# Patient Record
Sex: Female | Born: 1970 | Race: White | Hispanic: No | Marital: Married | Smoking: Never smoker
Health system: Southern US, Community
[De-identification: ages and names within clinical notes are randomized; demographics above are authoritative.]

## PROBLEM LIST (undated history)

## (undated) DIAGNOSIS — C439 Malignant melanoma of skin, unspecified: Secondary | ICD-10-CM

## (undated) DIAGNOSIS — I639 Cerebral infarction, unspecified: Secondary | ICD-10-CM

## (undated) DIAGNOSIS — I10 Essential (primary) hypertension: Secondary | ICD-10-CM

## (undated) DIAGNOSIS — E785 Hyperlipidemia, unspecified: Secondary | ICD-10-CM

## (undated) DIAGNOSIS — E119 Type 2 diabetes mellitus without complications: Secondary | ICD-10-CM

## (undated) HISTORY — DX: Cerebral infarction, unspecified: I63.9

## (undated) HISTORY — DX: Malignant melanoma of skin, unspecified: C43.9

## (undated) HISTORY — DX: Hyperlipidemia, unspecified: E78.5

## (undated) HISTORY — DX: Type 2 diabetes mellitus without complications: E11.9

## (undated) HISTORY — DX: Essential (primary) hypertension: I10

---

## 2020-10-01 ENCOUNTER — Other Ambulatory Visit: Payer: Self-pay | Admitting: Family Medicine

## 2020-10-01 DIAGNOSIS — Z1231 Encounter for screening mammogram for malignant neoplasm of breast: Secondary | ICD-10-CM

## 2021-01-11 ENCOUNTER — Ambulatory Visit
Admission: RE | Admit: 2021-01-11 | Discharge: 2021-01-11 | Disposition: A | Discharge status: Home / Self Care | Payer: 59 | Source: Ambulatory Visit | Attending: Family Medicine | Admitting: Family Medicine

## 2021-01-11 ENCOUNTER — Other Ambulatory Visit: Payer: Self-pay

## 2021-01-11 DIAGNOSIS — Z1231 Encounter for screening mammogram for malignant neoplasm of breast: Secondary | ICD-10-CM

## 2021-06-01 ENCOUNTER — Other Ambulatory Visit: Payer: Self-pay | Admitting: Family Medicine

## 2021-06-01 DIAGNOSIS — R112 Nausea with vomiting, unspecified: Secondary | ICD-10-CM

## 2021-06-06 ENCOUNTER — Ambulatory Visit
Admission: RE | Admit: 2021-06-06 | Discharge: 2021-06-06 | Disposition: A | Discharge status: Home / Self Care | Payer: 59 | Source: Ambulatory Visit | Attending: Family Medicine | Admitting: Family Medicine

## 2021-06-06 DIAGNOSIS — R112 Nausea with vomiting, unspecified: Secondary | ICD-10-CM

## 2021-06-29 ENCOUNTER — Other Ambulatory Visit (HOSPITAL_COMMUNITY): Payer: Self-pay | Admitting: Family Medicine

## 2021-06-29 ENCOUNTER — Other Ambulatory Visit: Payer: Self-pay | Admitting: Family Medicine

## 2021-06-29 DIAGNOSIS — R112 Nausea with vomiting, unspecified: Secondary | ICD-10-CM

## 2021-07-06 ENCOUNTER — Other Ambulatory Visit: Payer: Self-pay

## 2021-07-06 ENCOUNTER — Ambulatory Visit (HOSPITAL_COMMUNITY)
Admission: RE | Admit: 2021-07-06 | Discharge: 2021-07-06 | Disposition: A | Discharge status: Home / Self Care | Payer: 59 | Source: Ambulatory Visit | Attending: Family Medicine | Admitting: Family Medicine

## 2021-07-06 DIAGNOSIS — R112 Nausea with vomiting, unspecified: Secondary | ICD-10-CM | POA: Diagnosis present

## 2021-07-06 IMAGING — NM NM HEPATO W/GB/PHARM/[PERSON_NAME]
2 series · 12 of 12 positions shown · non-contrast
Comparison: None.

CLINICAL DATA: Diarrhea and fatty liver

EXAM:
NUCLEAR MEDICINE HEPATOBILIARY IMAGING WITH GALLBLADDER EF
TECHNIQUE: Sequential images of the abdomen were obtained [DATE] minutes
following intravenous administration of radiopharmaceutical. After
oral ingestion of Ensure, gallbladder ejection fraction was
determined. At 60 min, normal ejection fraction is greater than 33%.
RADIOPHARMACEUTICALS:  4.8 mCi [LB]  Choletec IV

[Series 1: biliary · 4.14mm/px · 6 of 60 frames shown]
[frame 6/60]
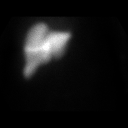
[frame 16/60]
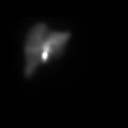
[frame 26/60]
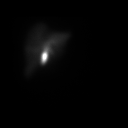
[frame 36/60]
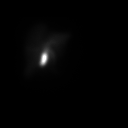
[frame 46/60]
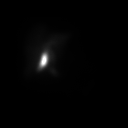
[frame 56/60]
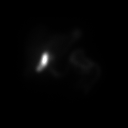

[Series 2: gbef · 4.14mm/px · 6 of 60 frames shown]
[frame 6/60]
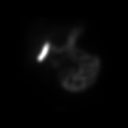
[frame 16/60]
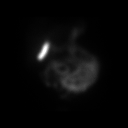
[frame 26/60]
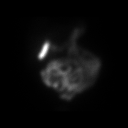
[frame 36/60]
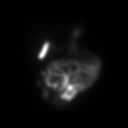
[frame 46/60]
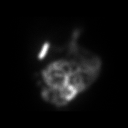
[frame 56/60]
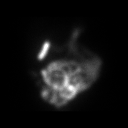

[12 of 12 positions shown; findings below may reference images not displayed]

FINDINGS: Prompt uptake and biliary excretion of activity by the liver is
seen. Gallbladder activity is visualized, consistent with patency of
cystic duct. Biliary activity passes into small bowel, consistent
with patent common bile duct.

Calculated gallbladder ejection fraction is 64%. (Normal gallbladder
ejection fraction with Ensure is greater than 33%.)
IMPRESSION: Normal uptake and excretion of biliary tracer.

Normal gallbladder ejection fraction at 64%.

## 2021-07-06 MED ORDER — TECHNETIUM TC 99M MEBROFENIN IV KIT
4.8000 | PACK | Freq: Once | INTRAVENOUS | Status: AC | PRN
  Administered 2021-07-06: 11:00:00 4.8 via INTRAVENOUS

## 2021-11-10 ENCOUNTER — Other Ambulatory Visit: Payer: Self-pay | Admitting: Family Medicine

## 2021-11-10 DIAGNOSIS — Z1231 Encounter for screening mammogram for malignant neoplasm of breast: Secondary | ICD-10-CM

## 2022-01-09 ENCOUNTER — Other Ambulatory Visit: Payer: Self-pay | Admitting: Cardiovascular Disease

## 2022-01-09 ENCOUNTER — Ambulatory Visit: Payer: 59 | Admitting: Cardiovascular Disease

## 2022-01-09 ENCOUNTER — Encounter: Payer: Self-pay | Admitting: Cardiovascular Disease

## 2022-01-09 VITALS — BP 110/60 | HR 63 | Ht 65.0 in | Wt 236.4 lb

## 2022-01-09 DIAGNOSIS — I4891 Unspecified atrial fibrillation: Secondary | ICD-10-CM

## 2022-01-09 DIAGNOSIS — I639 Cerebral infarction, unspecified: Secondary | ICD-10-CM

## 2022-01-10 LAB — BASIC METABOLIC PANEL
BUN/Creatinine Ratio: 14 (ref 9–23)
BUN: 9 mg/dL (ref 6–24)
CO2: 23 mmol/L (ref 20–29)
Calcium: 9.7 mg/dL (ref 8.7–10.2)
Chloride: 106 mmol/L (ref 96–106)
Creatinine, Ser: 0.63 mg/dL (ref 0.57–1.00)
Glucose: 83 mg/dL (ref 70–99)
Potassium: 4.4 mmol/L (ref 3.5–5.2)
Sodium: 145 mmol/L — ABNORMAL HIGH (ref 134–144)
eGFR: 107 mL/min/{1.73_m2} (ref >59)

## 2022-01-12 ENCOUNTER — Ambulatory Visit (INDEPENDENT_AMBULATORY_CARE_PROVIDER_SITE_OTHER): Payer: 59

## 2022-01-12 DIAGNOSIS — I4891 Unspecified atrial fibrillation: Secondary | ICD-10-CM

## 2022-01-12 DIAGNOSIS — I639 Cerebral infarction, unspecified: Secondary | ICD-10-CM | POA: Diagnosis not present

## 2022-01-20 ENCOUNTER — Ambulatory Visit
Admission: RE | Admit: 2022-01-20 | Discharge: 2022-01-20 | Disposition: A | Discharge status: Home / Self Care | Payer: 59 | Source: Ambulatory Visit

## 2022-01-20 DIAGNOSIS — Z1231 Encounter for screening mammogram for malignant neoplasm of breast: Secondary | ICD-10-CM

## 2022-01-24 ENCOUNTER — Encounter (HOSPITAL_COMMUNITY): Payer: Self-pay | Admitting: Cardiology

## 2022-01-24 NOTE — Progress Notes (Signed)
Attempted to obtain medical history via telephone, unable to reach at this time. HIPAA compliant voicemail message left requesting return call to pre surgical testing department. 

## 2022-01-25 ENCOUNTER — Encounter: Payer: Self-pay | Admitting: Neurology

## 2022-01-25 ENCOUNTER — Ambulatory Visit: Payer: 59 | Admitting: Neurology

## 2022-01-25 VITALS — BP 123/83 | HR 69 | Ht 65.0 in | Wt 232.0 lb

## 2022-01-25 DIAGNOSIS — I63512 Cerebral infarction due to unspecified occlusion or stenosis of left middle cerebral artery: Secondary | ICD-10-CM

## 2022-01-25 DIAGNOSIS — R404 Transient alteration of awareness: Secondary | ICD-10-CM

## 2022-01-25 NOTE — Patient Instructions (Addendum)
Continue with Aspirin 81 mg daily  Continue with your other mediaitons  Ambulatory EEG  No need for TEE Return in 1 year

## 2022-01-25 NOTE — Progress Notes (Signed)
[facility]  PATIENT: Mercedes [name] DOB: 24-Jul-1970  REQUESTING CLINICIAN: Kathyrn Lass, MD HISTORY FROM: Patient  REASON FOR VISIT: Left frontal stroke follow up    HISTORICAL  CHIEF COMPLAINT:  Chief Complaint  Patient presents with   New Patient (Initial Visit)    Room 12, alone Her for CVA FU, States she is doing well     HISTORY OF PRESENT ILLNESS:  This is a 51 year old woman with vascular risk factor including hypertension, hyperlipidemia, diabetes mellitus and obesity who is presenting after being diagnosed with left frontal stroke on June 11.  Patient reports on that day she was driving home and had a sudden onset of inability to focus, asking the same questions, she was able to pull over and son drove her to the ED.  In the ED she was initially thought to have a TGA but MRI showed an acute left frontal stroke and an old left frontal stroke.  She was admitted, had additional work-up including CTA head and neck which showed no large vessel occlusion, EEG which was negative for epileptiform discharges and echocardiogram with a normal EF. While she was admitted also she was found to have UTI and treated with antibiotics.  Currently she is back to her normal self and denies any symptoms and has no complaints. She did follow with cardiology and currently is already wearing a heart monitor Patient also report a week prior to presentation she had a similar episode of confusion and inability to focus lasting couple hours.  A years ago she had the same episode, in total she had 3 episodes.     OTHER MEDICAL CONDITIONS: Diabetes Mellitus, Hypertension,    REVIEW OF SYSTEMS: Full 14 system review of systems performed and negative with exception of: as noted in the HPI   ALLERGIES: Allergies  Allergen Reactions   Codeine Nausea And Vomiting   Nitrofurantoin Hives         HOME MEDICATIONS: Outpatient Medications Prior to Visit  Medication Sig  Dispense Refill   aspirin EC 325 MG tablet Take 325 mg by mouth daily.     cetirizine (ZYRTEC) 10 MG tablet Take 10 mg by mouth every evening.     Cholecalciferol (VITAMIN D) 50 MCG (2000 UT) CAPS Take 2,000 Units by mouth daily.     cyanocobalamin 100 MCG tablet Take 100 mcg by mouth daily.     empagliflozin (JARDIANCE) 25 MG TABS tablet Take 25 mg by mouth daily.     metFORMIN (GLUCOPHAGE-XR) 500 MG 24 hr tablet Take 2,000 mg by mouth every evening.     MOUNJARO 5 MG/0.5ML Pen Inject 5 mg into the skin every Friday.     MULTIPLE VITAMINS-MINERALS PO Take 1 tablet by mouth daily.     olmesartan (BENICAR) 20 MG tablet Take 20 mg by mouth daily.     rosuvastatin (CRESTOR) 10 MG tablet Take 10 mg by mouth daily.     No facility-administered medications prior to visit.    PAST MEDICAL HISTORY: Past Medical History:  Diagnosis Date   Diabetes mellitus without complication ([place])    Hyperlipidemia    Hypertension    Melanoma ([place])    Stroke ([place])     PAST SURGICAL HISTORY: History reviewed. No pertinent surgical history.  FAMILY HISTORY: Family History  Problem Relation Age of Onset   Arrhythmia Father    Heart attack Brother     SOCIAL HISTORY: Social History   Socioeconomic History   Marital status: Married  Spouse name: Not on file   Number of children: 2   Years of education: Not on file   Highest education level: Not on file  Occupational History   Occupation: [employer]  Tobacco Use   Smoking status: Never   Smokeless tobacco: Never  Substance and Sexual Activity   Alcohol use: Never   Drug use: Never   Sexual activity: Not on file  Other Topics Concern   Not on file  Social History Narrative   Not on file   Social Determinants of Health   Financial Resource Strain: Not on file  Food Insecurity: Not on file  Transportation Needs: Not on file  Physical Activity: Not on file  Stress: Not on file  Social Connections: Not on file  Intimate Partner  Violence: Not on file    PHYSICAL EXAM  GENERAL EXAM/CONSTITUTIONAL: Vitals:  Vitals:   01/25/22 0809  BP: 123/83  Pulse: 69  Weight: 232 lb (105.2 kg)  [name]: '5\' 5"'$  (1.651 m)   Body mass index is 38.61 kg/m. Wt Readings from Last 3 Encounters:  01/25/22 232 lb (105.2 kg)  01/09/22 236 lb 6.4 oz (107.2 kg)   Patient is in no distress; well developed, nourished and groomed; neck is supple  EYES: Pupils round and reactive to light, Visual fields full to confrontation, Extraocular movements intacts,   MUSCULOSKELETAL: Gait, strength, tone, movements noted in Neurologic exam below  NEUROLOGIC: MENTAL STATUS:      No data to display         awake, alert, oriented to person, place and time recent and remote memory intact normal attention and concentration language fluent, comprehension intact, naming intact fund of knowledge appropriate  CRANIAL NERVE:  2nd, 3rd, 4th, 6th - pupils equal and reactive to light, visual fields full to confrontation, extraocular muscles intact, no nystagmus 5th - facial sensation symmetric 7th - facial strength symmetric 8th - hearing intact 9th - palate elevates symmetrically, uvula midline 11th - shoulder shrug symmetric 12th - tongue protrusion midline  MOTOR:  normal bulk and tone, full strength in the BUE, BLE  SENSORY:  normal and symmetric to light touch, pinprick, temperature, vibration  COORDINATION:  finger-nose-finger, fine finger movements normal  REFLEXES:  deep tendon reflexes present and symmetric  GAIT/STATION:  normal   DIAGNOSTIC DATA (LABS, IMAGING, TESTING) - I reviewed patient records, labs, notes, testing and imaging myself where available.  No results found for: "WBC", "HGB", "HCT", "MCV", "PLT"    Component Value Date/Time   NA 145 (H) 01/09/2022 1648   K 4.4 01/09/2022 1648   CL 106 01/09/2022 1648   CO2 23 01/09/2022 1648   GLUCOSE 83 01/09/2022 1648   BUN 9 01/09/2022 1648   CREATININE  0.63 01/09/2022 1648   CALCIUM 9.7 01/09/2022 1648   No results found for: "CHOL", "HDL", "LDLCALC", "LDLDIRECT", "TRIG", "CHOLHDL" No results found for: "HGBA1C" No results found for: "VITAMINB12" No results found for: "TSH"  EEG: normal, no seizures, no epileptiform discharge  CT Head: No acute abnormalities  MRI Brain: Old left frontal stroke and acute stroke in the left parietal paramedian cortex  CTA Head and Neck: No large vessel occlusion  Normal Echo with EF 60-65%    ASSESSMENT AND PLAN  51 y.o. year old female with vascular risk factors including hypertension, hyperlipidemia, diabetes mellitus who is presenting after being diagnosed with left frontal stroke.  Stroke etiology likely small vessel disease.  She has completed DAPT with aspirin and Plavix for 21 days  and now on aspirin 325 mg.  I have recommended patient to go back to aspirin 81 mg daily, continue her other medications.  For her recurrent episode of confusion and inability to focus, she had a routine EEG which was negative for any epileptiform discharges but I think it is reasonable to obtain a 48-hour ambulatory EEG.  I will contact the patient to go over the result, if abnormal will most likely start antiseizure medication.  I will see her in 1 year for follow-up. Advised her to contact me sooner if her symptoms worsen    1. Cerebrovascular accident (CVA) due to occlusion of left middle cerebral artery ([place])   2. Transient alteration of awareness      Patient Instructions  Continue with Aspirin 81 mg daily  Continue with your other mediaitons  Ambulatory EEG  No need for TEE Return in 1 year   No orders of the defined types were placed in this encounter.   No orders of the defined types were placed in this encounter.   Return in about 1 year (around 01/26/2023).  I have spent a total of 60 minutes dedicated to this patient today, preparing to see patient, performing a medically appropriate examination  and evaluation, ordering tests and/or medications and procedures, and counseling and educating the patient/family/caregiver; independently interpreting result and communicating results to the family/patient/caregiver; and documenting clinical information in the electronic medical record.  Alric Ran, MD 01/25/2022, 8:50 AM  [facility] [address], [place], [place] [address] [phone]

## 2022-01-31 ENCOUNTER — Ambulatory Visit (HOSPITAL_COMMUNITY): Admission: RE | Admit: 2022-01-31 | Payer: 59 | Source: Home / Self Care | Admitting: Cardiology

## 2022-01-31 ENCOUNTER — Encounter (HOSPITAL_COMMUNITY): Admission: RE | Payer: Self-pay | Source: Home / Self Care

## 2022-01-31 DIAGNOSIS — I639 Cerebral infarction, unspecified: Secondary | ICD-10-CM

## 2022-01-31 SURGERY — ECHOCARDIOGRAM, TRANSESOPHAGEAL
Anesthesia: Monitor Anesthesia Care

## 2022-02-28 DIAGNOSIS — G40909 Epilepsy, unspecified, not intractable, without status epilepticus: Secondary | ICD-10-CM | POA: Diagnosis not present

## 2022-03-01 DIAGNOSIS — G40909 Epilepsy, unspecified, not intractable, without status epilepticus: Secondary | ICD-10-CM | POA: Diagnosis not present

## 2022-03-07 ENCOUNTER — Encounter: Payer: Self-pay | Admitting: Neurology

## 2022-03-07 DIAGNOSIS — R404 Transient alteration of awareness: Secondary | ICD-10-CM

## 2022-03-07 NOTE — Procedures (Signed)
   Clinical History : This is a 51 y/o F with vascular factors including HTN, hyperlipidemia, and diabetes who presents after recent diagnosis with left frontal stroke.  INTERMITTENT MONITORING with VIDEO TECHNICAL SUMMARY: This AVEEG was performed using equipment provided by [employer] utilizing Bluetooth ( [employer] ) amplifiers with continuous EEGT attended video collection using encrypted remote transmission via [place] secured cellular tower network with data rates for each AVEEG performed. This is a Biomedical engineer AVEEG, obtained, according to the 10-20 international electrode placement system, reformatted digitally into referential and bipolar montages. Data was acquired with a minimum of 21 bipolar connections and sampled at a minimum rate of 250 cycles per second per channel, maximum rate of 450 cycles per second per channel and two channels for EKG. The entire VEEG study was recorded through cable and or radio telemetry for subsequent analysis. Specified epochs of the AVEEG data were identified at the direction of the subject by the depression of a push button by the patient. Each patients event file included data acquired two minutes prior to the push button activation and continuing until two minutes afterwards. AVEEG files were reviewed on [place], Licensed Software provided by [employer] with a digital high frequency filter set at 70 Hz and a low frequency filter set at 1 Hz with a paper speed of 54m/s resulting in 10 seconds per digital page. This entire AVEEG was reviewed by the EEG Technologist. Random time samples, random sleep samples, clips, patient initiated push button files with included patient daily diary logs, EEG Technologist pruned data was reviewed and verified for accuracy and validity by the governing reading neurologist in full details. This AEEGV was fully compliant with all requirements for CPT 97500 for setup, patient education, take down and  administered by an EEG technologist. Long-Term EEG with Video was monitored intermittently by a qualified EEG technologist for the entirety of the recording; quality check-ins were performed at a minimum of every two hours, checking and documenting real-time data and video to assure the integrity and quality of the recording (e.g., camera position, electrode integrity and impedance), and identify the need for maintenance. For intermittent monitoring, an EEG Technologist monitored no more than 12 patients concurrently. Diagnostic video was captured at least 80% of the time during the recording.  PATIENT EVENTS: There were no patient events noted or captured during this recording.  TECHNOLOGIST EVENTS: No clear epileptiform activity was detected by the reviewing neurodiagnostic technologist during the recording for further evaluation.  TIME SAMPLES: 10-minutes of every 2 hours recorded are reviewed as random time samples.  SLEEP SAMPLES: 5-minutes of every 24 hour recorded sleep cycle are reviewed as random sleep samples.  AWAKE: At maximal level of alertness, the posterior dominant background activity was continuous, reactive, low voltage rhythm of 10-10.5 Hz. This was symmetric, well-modulated, and attenuated with eye opening. Diffuse, symmetric, frontocentral beta range activity was present.  SLEEP: N1 Sleep (Stage 1) was observed and characterized by the disappearance of alpha rhythm and the appearance of vertex activity. N2 Sleep (Stage 2) was observed and characterized by vertex waves, K-complexes, and sleep spindles. N3 (Stage 3) sleep was observed and characterized by high amplitude Delta activity of 20%. REM sleep was observed.  EKG: There were no arrhythmias or abnormalities noted during this recording.   Impression: This was a normal 48 hours ambulatory EEG. No seizures, no epileptiform discharges and no events were captured.   AAlric Ran MD [facility]

## 2022-03-07 NOTE — Progress Notes (Signed)
Thank you Dr. Audie Box

## 2022-03-22 NOTE — Progress Notes (Deleted)
Cardiology Office Note:   Date:  03/22/2022  NAME:  Georgiann Neider    MRN: [id number] DOB:  04/01/1971   PCP:  Kathyrn Lass, MD  Cardiologist:  None  Electrophysiologist:  None   Referring MD: Kathyrn Lass, MD   No chief complaint on file. ***  History of Present Illness:   Alishia Lebo is a 51 y.o. female with a hx of DM, CVA, HLD, HTN who presents for follow-up.   Problem List CVA -L parietal paramedian cortex  -negative monitor  DM -A1c 8.1 HLD -T chol 115, HDL 54, LDL 38, TG 117 HTN  Past Medical History: Past Medical History:  Diagnosis Date   Diabetes mellitus without complication ([place])    Hyperlipidemia    Hypertension    Melanoma ([place])    Stroke [facility])     Past Surgical History: No past surgical history on file.  Current Medications: No outpatient medications have been marked as taking for the 03/24/22 encounter (Appointment) with O'Neal, Cassie Freer, MD.     Allergies:    Codeine and Nitrofurantoin   Social History: Social History   Socioeconomic History   Marital status: Married    Spouse name: Not on file   Number of children: 2   Years of education: Not on file   Highest education level: Not on file  Occupational History   Occupation: Licensed conveyancer  Tobacco Use   Smoking status: Never   Smokeless tobacco: Never  Substance and Sexual Activity   Alcohol use: Never   Drug use: Never   Sexual activity: Not on file  Other Topics Concern   Not on file  Social History Narrative   Not on file   Social Determinants of Health   Financial Resource Strain: Not on file  Food Insecurity: Not on file  Transportation Needs: Not on file  Physical Activity: Not on file  Stress: Not on file  Social Connections: Not on file     Family History: The patient's ***family history includes Arrhythmia in her father; Heart attack in her brother.  ROS:   All other ROS reviewed and negative. Pertinent positives noted in the HPI.     EKGs/Labs/Other  Studies Reviewed:   The following studies were personally reviewed by me today:  EKG:  EKG is *** ordered today.  The ekg ordered today demonstrates ***, and was personally reviewed by me.   Recent Labs: 01/09/2022: BUN 9; Creatinine, Ser 0.63; Potassium 4.4; Sodium 145   Recent Lipid Panel No results found for: "CHOL", "TRIG", "HDL", "CHOLHDL", "VLDL", "LDLCALC", "LDLDIRECT"  Physical Exam:   VS:  There were no vitals taken for this visit.   Wt Readings from Last 3 Encounters:  01/25/22 232 lb (105.2 kg)  01/09/22 236 lb 6.4 oz (107.2 kg)    General: Well nourished, well developed, in no acute distress Head: Atraumatic, normal size  Eyes: PEERLA, EOMI  Neck: Supple, no JVD Endocrine: No thryomegaly Cardiac: Normal S1, S2; RRR; no murmurs, rubs, or gallops Lungs: Clear to auscultation bilaterally, no wheezing, rhonchi or rales  Abd: Soft, nontender, no hepatomegaly  Ext: No edema, pulses 2+ Musculoskeletal: No deformities, BUE and BLE strength normal and equal Skin: Warm and dry, no rashes   Neuro: Alert and oriented to person, place, time, and situation, CNII-XII grossly intact, no focal deficits  Psych: Normal mood and affect   ASSESSMENT:   Leaira Fullam is a 51 y.o. female who presents for the following: No diagnosis found.  PLAN:   There  are no diagnoses linked to this encounter.  {Are you ordering a CV Procedure (e.g. stress test, cath, DCCV, TEE, etc)?   Press F2        :[id number]}  Disposition: No follow-ups on file.  Medication Adjustments/Labs and Tests Ordered: Current medicines are reviewed at length with the patient today.  Concerns regarding medicines are outlined above.  No orders of the defined types were placed in this encounter.  No orders of the defined types were placed in this encounter.   There are no Patient Instructions on file for this visit.   Time Spent with Patient: I have spent a total of *** minutes with patient reviewing hospital  notes, telemetry, EKGs, labs and examining the patient as well as establishing an assessment and plan that was discussed with the patient.  > 50% of time was spent in direct patient care.  Signed, Addison Naegeli. Audie Box, MD, [place]  [address], [place], [place] [address] [phone]  03/22/2022 8:52 PM

## 2022-03-24 ENCOUNTER — Ambulatory Visit: Payer: 59 | Attending: Cardiovascular Disease | Admitting: Cardiovascular Disease

## 2022-03-24 DIAGNOSIS — I639 Cerebral infarction, unspecified: Secondary | ICD-10-CM

## 2022-11-28 ENCOUNTER — Other Ambulatory Visit: Payer: Self-pay | Admitting: Family Medicine

## 2022-11-28 DIAGNOSIS — Z1231 Encounter for screening mammogram for malignant neoplasm of breast: Secondary | ICD-10-CM

## 2022-12-29 ENCOUNTER — Other Ambulatory Visit: Payer: Self-pay | Admitting: Family Medicine

## 2022-12-29 DIAGNOSIS — Z8262 Family history of osteoporosis: Secondary | ICD-10-CM

## 2022-12-29 DIAGNOSIS — Z780 Asymptomatic menopausal state: Secondary | ICD-10-CM

## 2022-12-29 DIAGNOSIS — Z1231 Encounter for screening mammogram for malignant neoplasm of breast: Secondary | ICD-10-CM

## 2023-01-03 ENCOUNTER — Other Ambulatory Visit (HOSPITAL_BASED_OUTPATIENT_CLINIC_OR_DEPARTMENT_OTHER): Payer: Self-pay | Admitting: Physician Assistant

## 2023-01-03 DIAGNOSIS — R198 Other specified symptoms and signs involving the digestive system and abdomen: Secondary | ICD-10-CM

## 2023-01-04 ENCOUNTER — Other Ambulatory Visit (HOSPITAL_BASED_OUTPATIENT_CLINIC_OR_DEPARTMENT_OTHER): Payer: Self-pay | Admitting: Physician Assistant

## 2023-01-04 ENCOUNTER — Ambulatory Visit (HOSPITAL_BASED_OUTPATIENT_CLINIC_OR_DEPARTMENT_OTHER)
Admission: RE | Admit: 2023-01-04 | Discharge: 2023-01-04 | Disposition: A | Discharge status: Home / Self Care | Payer: 59 | Source: Ambulatory Visit | Attending: Physician Assistant | Admitting: Physician Assistant

## 2023-01-04 DIAGNOSIS — R198 Other specified symptoms and signs involving the digestive system and abdomen: Secondary | ICD-10-CM

## 2023-01-15 ENCOUNTER — Other Ambulatory Visit (HOSPITAL_BASED_OUTPATIENT_CLINIC_OR_DEPARTMENT_OTHER): Payer: Self-pay

## 2023-01-15 MED ORDER — MOUNJARO 12.5 MG/0.5ML ~~LOC~~ SOAJ
12.5000 mg | SUBCUTANEOUS | 0 refills | Status: DC
  Filled 2023-01-15: qty 2, 28d supply, fill #0

## 2023-01-29 ENCOUNTER — Encounter: Payer: Self-pay | Admitting: Neurology

## 2023-01-29 ENCOUNTER — Ambulatory Visit: Payer: 59 | Admitting: Neurology

## 2023-01-29 VITALS — BP 123/75 | HR 70 | Ht 65.0 in | Wt 219.0 lb

## 2023-01-29 DIAGNOSIS — I63512 Cerebral infarction due to unspecified occlusion or stenosis of left middle cerebral artery: Secondary | ICD-10-CM | POA: Diagnosis not present

## 2023-01-29 MED ORDER — ASPIRIN 81 MG PO TBEC: 81.0000 mg | DELAYED_RELEASE_TABLET | Freq: Every day | ORAL | 12 refills | Status: AC

## 2023-01-29 NOTE — Progress Notes (Signed)
[facility]  PATIENT: Mercedes [name] DOB: [birth year]  REQUESTING CLINICIAN: Sigmund Hazel, MD HISTORY FROM: Patient  REASON FOR VISIT: Left frontal stroke follow up    HISTORICAL  CHIEF COMPLAINT:  Chief Complaint  Patient presents with   Cerebrovascular Accident    Rm12, alone, pt is well w/no problems to report      INTERVAL HISTORY 01/29/2023:  Patient presents today for follow-up, last visit was a year ago since then she reports that she has been doing very well.  She did have an ambulatory EEG for her periods of confusion which was negative.  She has not had any additional episode of confusion.  During this time she reported losing weight, being off her diabetes medication her hemoglobin A1c is now in the normal range currently she is only on the [facility], she is not taking Jardiance or metformin anymore.  She continued aspirin 81 mg and Crestor 10.  Currently has no other complaint, no other concerns.  Overall she is doing very well.   HISTORY OF PRESENT ILLNESS:  This is a 52 year old woman with vascular risk factor including hypertension, hyperlipidemia, diabetes mellitus and obesity who is presenting after being diagnosed with left frontal stroke on June 11.  Patient reports on that day she was driving home and had a sudden onset of inability to focus, asking the same questions, she was able to pull over and son drove her to the ED.  In the ED she was initially thought to have a TGA but MRI showed an acute left frontal stroke and an old left frontal stroke.  She was admitted, had additional work-up including CTA head and neck which showed no large vessel occlusion, EEG which was negative for epileptiform discharges and echocardiogram with a normal EF. While she was admitted also she was found to have UTI and treated with antibiotics.  Currently she is back to her normal self and denies any symptoms and has no complaints. She did follow with cardiology and  currently is already wearing a heart monitor Patient also report a week prior to presentation she had a similar episode of confusion and inability to focus lasting couple hours.  A years ago she had the same episode, in total she had 3 episodes.     OTHER MEDICAL CONDITIONS: Diabetes Mellitus, Hypertension,    REVIEW OF SYSTEMS: Full 14 system review of systems performed and negative with exception of: as noted in the HPI   ALLERGIES: Allergies  Allergen Reactions   Codeine Nausea And Vomiting   Nitrofurantoin Hives         HOME MEDICATIONS: Outpatient Medications Prior to Visit  Medication Sig Dispense Refill   cetirizine (ZYRTEC) 10 MG tablet Take 10 mg by mouth every evening.     Cholecalciferol (VITAMIN D) 50 MCG (2000 UT) CAPS Take 2,000 Units by mouth daily.     cyanocobalamin 100 MCG tablet Take 100 mcg by mouth daily.     MULTIPLE VITAMINS-MINERALS PO Take 1 tablet by mouth daily.     olmesartan (BENICAR) 20 MG tablet Take 20 mg by mouth daily.     rosuvastatin (CRESTOR) 10 MG tablet Take 10 mg by mouth daily.     tirzepatide (MOUNJARO) 12.5 MG/0.5ML Pen Inject 12.5 mg into the skin once a week. 2 mL 0   aspirin EC 325 MG tablet Take 325 mg by mouth daily.     empagliflozin (JARDIANCE) 25 MG TABS tablet Take 25 mg by mouth daily.  metFORMIN (GLUCOPHAGE-XR) 500 MG 24 hr tablet Take 2,000 mg by mouth every evening.     MOUNJARO 5 MG/0.5ML Pen Inject 5 mg into the skin every Friday.     No facility-administered medications prior to visit.    PAST MEDICAL HISTORY: Past Medical History:  Diagnosis Date   Diabetes mellitus without complication (HCC)    Hyperlipidemia    Hypertension    Melanoma (HCC)    Stroke (HCC)     PAST SURGICAL HISTORY: History reviewed. No pertinent surgical history.  FAMILY HISTORY: Family History  Problem Relation Age of Onset   Arrhythmia Father    Heart attack Brother     SOCIAL HISTORY: Social History   Socioeconomic History    Marital status: Legally Separated    Spouse name: Not on file   Number of children: 2   Years of education: Not on file   Highest education level: Not on file  Occupational History   Occupation: Publishing copy  Tobacco Use   Smoking status: Never   Smokeless tobacco: Never  Vaping Use   Vaping status: Never Used  Substance and Sexual Activity   Alcohol use: Never   Drug use: Never   Sexual activity: Yes    Birth control/protection: None  Other Topics Concern   Not on file  Social History Narrative   Not on file   Social Determinants of Health   Financial Resource Strain: Not on file  Food Insecurity: Not on file  Transportation Needs: No Transportation Needs (12/26/2021)   Received from [facility], [facility]   PRAPARE - Transportation    Lack of Transportation (Medical): No    Lack of Transportation (Non-Medical): No  Physical Activity: Not on file  Stress: Not on file  Social Connections: Unknown (12/25/2021)   Received from [facility], [facility]   Social Network    Social Network: Not on file  Intimate Partner Violence: Unknown (12/25/2021)   Received from [facility], [facility]   HITS    Physically Hurt: Not on file    Insult or Talk Down To: Not on file    Threaten Physical Harm: Not on file    Scream or Curse: Not on file    PHYSICAL EXAM  GENERAL EXAM/CONSTITUTIONAL: Vitals:  Vitals:   01/29/23 0915  BP: 123/75  Pulse: 70  Weight: 219 lb (99.3 kg)  Height: 5\' 5"  (1.651 m)    Body mass index is 36.44 kg/m. Wt Readings from Last 3 Encounters:  01/29/23 219 lb (99.3 kg)  01/25/22 232 lb (105.2 kg)  01/09/22 236 lb 6.4 oz (107.2 kg)   Patient is in no distress; well developed, nourished and groomed; neck is supple  MUSCULOSKELETAL: Gait, strength, tone, movements noted in Neurologic exam below  NEUROLOGIC: MENTAL STATUS:      No data to display         awake, alert, oriented to person, place and time recent and remote  memory intact normal attention and concentration language fluent, comprehension intact, naming intact fund of knowledge appropriate  CRANIAL NERVE:  2nd, 3rd, 4th, 6th -visual fields full to confrontation, extraocular muscles intact, no nystagmus 5th - facial sensation symmetric 7th - facial strength symmetric 8th - hearing intact 9th - palate elevates symmetrically, uvula midline 11th - shoulder shrug symmetric 12th - tongue protrusion midline  MOTOR:  normal bulk and tone, full strength in the BUE, BLE  SENSORY:  normal and symmetric to light touch,  COORDINATION:  finger-nose-finger, fine finger movements  normal  REFLEXES:  deep tendon reflexes present and symmetric  GAIT/STATION:  normal   DIAGNOSTIC DATA (LABS, IMAGING, TESTING) - I reviewed patient records, labs, notes, testing and imaging myself where available.  No results found for: "WBC", "HGB", "HCT", "MCV", "PLT"    Component Value Date/Time   NA 145 (H) 01/09/2022 1648   K 4.4 01/09/2022 1648   CL 106 01/09/2022 1648   CO2 23 01/09/2022 1648   GLUCOSE 83 01/09/2022 1648   BUN 9 01/09/2022 1648   CREATININE 0.63 01/09/2022 1648   CALCIUM 9.7 01/09/2022 1648   No results found for: "CHOL", "HDL", "LDLCALC", "LDLDIRECT", "TRIG", "CHOLHDL" No results found for: "HGBA1C" No results found for: "VITAMINB12" No results found for: "TSH"  EEG: normal, no seizures, no epileptiform discharge  CT Head: No acute abnormalities  MRI Brain: Old left frontal stroke and acute stroke in the left parietal paramedian cortex  CTA Head and Neck: No large vessel occlusion  Normal Echo with EF 60-65%  Amb EEG 03/07/22 This was a normal 48 hours ambulatory EEG. No seizures, no epileptiform discharges and no events were captured.    ASSESSMENT AND PLAN  52 y.o. year old female with vascular risk factors including hypertension, hyperlipidemia, diabetes mellitus who is presenting after for follow up.  Overall she doing  very well, no current complaints or concerns.  Plan for patient to continue with aspirin 81 mg and Crestor 10, continue other medications and follow-up as needed.    1. Cerebrovascular accident (CVA) due to occlusion of left middle cerebral artery [facility])     Patient Instructions  Continue aspirin 81 mg daily Continue Crestor 10 mg Continue your other medications Continue to follow with PCP Return as needed    No orders of the defined types were placed in this encounter.   Meds ordered this encounter  Medications   aspirin EC 81 MG tablet    Sig: Take 1 tablet (81 mg total) by mouth daily. Swallow whole.    Dispense:  30 tablet    Refill:  12    Return if symptoms worsen or fail to improve.    Mercedes Norfolk, MD 01/29/2023, 9:28 AM  [facility] [address] [place], [place] [address] [phone]

## 2023-01-29 NOTE — Patient Instructions (Signed)
Continue aspirin 81 mg daily Continue Crestor 10 mg Continue your other medications Continue to follow with PCP Return as needed

## 2023-02-02 ENCOUNTER — Ambulatory Visit
Admission: RE | Admit: 2023-02-02 | Discharge: 2023-02-02 | Disposition: A | Discharge status: Home / Self Care | Payer: 59 | Source: Ambulatory Visit

## 2023-02-02 DIAGNOSIS — Z1231 Encounter for screening mammogram for malignant neoplasm of breast: Secondary | ICD-10-CM

## 2023-02-12 ENCOUNTER — Other Ambulatory Visit (HOSPITAL_BASED_OUTPATIENT_CLINIC_OR_DEPARTMENT_OTHER): Payer: Self-pay

## 2023-02-12 MED ORDER — GLUCOSE BLOOD VI STRP
ORAL_STRIP | 1 refills | Status: AC
  Filled 2023-02-12: qty 25, 25d supply, fill #0
  Filled 2023-03-15: qty 50, 30d supply, fill #1
  Filled 2023-05-07: qty 50, 30d supply, fill #2
  Filled 2023-05-24: qty 50, 30d supply, fill #3

## 2023-02-12 MED ORDER — OLMESARTAN MEDOXOMIL 20 MG PO TABS
10.0000 mg | ORAL_TABLET | Freq: Every day | ORAL | 1 refills | Status: AC
  Filled 2023-02-12 (×2): qty 15, 30d supply, fill #0

## 2023-02-12 MED ORDER — ROSUVASTATIN CALCIUM 10 MG PO TABS
10.0000 mg | ORAL_TABLET | Freq: Every day | ORAL | 1 refills | Status: DC
  Filled 2023-02-12: qty 90, 90d supply, fill #0
  Filled 2023-02-13 – 2023-02-23 (×2): qty 30, 30d supply, fill #0
  Filled 2023-03-15 – 2023-03-26 (×2): qty 30, 30d supply, fill #1
  Filled 2023-05-07: qty 30, 30d supply, fill #2

## 2023-02-13 ENCOUNTER — Other Ambulatory Visit (HOSPITAL_BASED_OUTPATIENT_CLINIC_OR_DEPARTMENT_OTHER): Payer: Self-pay

## 2023-02-13 MED ORDER — MOUNJARO 12.5 MG/0.5ML ~~LOC~~ SOAJ
12.5000 mg | SUBCUTANEOUS | 3 refills | Status: AC
  Filled 2023-02-13: qty 2, 28d supply, fill #0

## 2023-02-13 MED ORDER — VENLAFAXINE HCL 37.5 MG PO TABS
37.5000 mg | ORAL_TABLET | Freq: Every day | ORAL | 2 refills | Status: AC
  Filled 2023-02-13 – 2023-02-26 (×3): qty 30, 30d supply, fill #0
  Filled 2023-03-15 – 2023-03-26 (×2): qty 30, 30d supply, fill #1
  Filled 2023-05-07: qty 30, 30d supply, fill #2
  Filled 2023-05-31: qty 30, 30d supply, fill #3
  Filled 2023-06-27: qty 30, 30d supply, fill #4
  Filled 2023-07-21: qty 90, 90d supply, fill #5
  Filled 2023-07-25: qty 30, 30d supply, fill #5
  Filled 2023-08-18: qty 90, 90d supply, fill #6

## 2023-02-23 ENCOUNTER — Other Ambulatory Visit (HOSPITAL_BASED_OUTPATIENT_CLINIC_OR_DEPARTMENT_OTHER): Payer: Self-pay

## 2023-02-23 ENCOUNTER — Other Ambulatory Visit: Payer: Self-pay

## 2023-02-26 ENCOUNTER — Other Ambulatory Visit: Payer: Self-pay

## 2023-02-26 ENCOUNTER — Other Ambulatory Visit (HOSPITAL_BASED_OUTPATIENT_CLINIC_OR_DEPARTMENT_OTHER): Payer: Self-pay

## 2023-03-07 ENCOUNTER — Other Ambulatory Visit (HOSPITAL_BASED_OUTPATIENT_CLINIC_OR_DEPARTMENT_OTHER): Payer: Self-pay

## 2023-03-07 MED ORDER — MOUNJARO 15 MG/0.5ML ~~LOC~~ SOAJ
15.0000 mg | SUBCUTANEOUS | 0 refills | Status: AC
  Filled 2023-03-07: qty 2, 28d supply, fill #0
  Filled 2023-04-02: qty 2, 28d supply, fill #1

## 2023-03-15 ENCOUNTER — Other Ambulatory Visit: Payer: Self-pay

## 2023-03-15 ENCOUNTER — Other Ambulatory Visit (HOSPITAL_BASED_OUTPATIENT_CLINIC_OR_DEPARTMENT_OTHER): Payer: Self-pay

## 2023-03-27 ENCOUNTER — Other Ambulatory Visit (HOSPITAL_BASED_OUTPATIENT_CLINIC_OR_DEPARTMENT_OTHER): Payer: Self-pay

## 2023-04-05 ENCOUNTER — Other Ambulatory Visit (HOSPITAL_BASED_OUTPATIENT_CLINIC_OR_DEPARTMENT_OTHER): Payer: Self-pay | Admitting: Family Medicine

## 2023-04-05 DIAGNOSIS — N950 Postmenopausal bleeding: Secondary | ICD-10-CM

## 2023-04-20 ENCOUNTER — Ambulatory Visit (HOSPITAL_BASED_OUTPATIENT_CLINIC_OR_DEPARTMENT_OTHER)
Admission: RE | Admit: 2023-04-20 | Discharge: 2023-04-20 | Disposition: A | Discharge status: Home / Self Care | Payer: 59 | Source: Ambulatory Visit | Attending: Family Medicine | Admitting: Family Medicine

## 2023-04-20 DIAGNOSIS — N950 Postmenopausal bleeding: Secondary | ICD-10-CM | POA: Diagnosis present

## 2023-05-07 ENCOUNTER — Other Ambulatory Visit (HOSPITAL_BASED_OUTPATIENT_CLINIC_OR_DEPARTMENT_OTHER): Payer: Self-pay

## 2023-05-07 ENCOUNTER — Other Ambulatory Visit: Payer: Self-pay

## 2023-05-08 ENCOUNTER — Other Ambulatory Visit (HOSPITAL_BASED_OUTPATIENT_CLINIC_OR_DEPARTMENT_OTHER): Payer: Self-pay

## 2023-05-08 MED ORDER — MOUNJARO 5 MG/0.5ML ~~LOC~~ SOAJ
5.0000 mg | SUBCUTANEOUS | 0 refills | Status: AC
  Filled 2023-05-08: qty 2, 28d supply, fill #0
  Filled 2023-06-05: qty 2, 28d supply, fill #1

## 2023-05-08 MED ORDER — ONDANSETRON HCL 4 MG PO TABS
ORAL_TABLET | ORAL | 0 refills | Status: AC
  Filled 2023-05-08: qty 20, 30d supply, fill #0

## 2023-05-24 ENCOUNTER — Other Ambulatory Visit (HOSPITAL_BASED_OUTPATIENT_CLINIC_OR_DEPARTMENT_OTHER): Payer: Self-pay

## 2023-05-24 ENCOUNTER — Other Ambulatory Visit: Payer: Self-pay

## 2023-05-24 MED ORDER — ROSUVASTATIN CALCIUM 20 MG PO TABS
20.0000 mg | ORAL_TABLET | Freq: Every day | ORAL | 0 refills | Status: DC
  Filled 2023-05-24: qty 90, 90d supply, fill #0

## 2023-05-24 MED ORDER — GLUCOSE BLOOD VI STRP
1.0000 | ORAL_STRIP | Freq: Every day | 3 refills | Status: AC
  Filled 2023-05-30: qty 50, 30d supply, fill #0

## 2023-05-30 ENCOUNTER — Other Ambulatory Visit (HOSPITAL_BASED_OUTPATIENT_CLINIC_OR_DEPARTMENT_OTHER): Payer: Self-pay

## 2023-06-05 ENCOUNTER — Other Ambulatory Visit (HOSPITAL_BASED_OUTPATIENT_CLINIC_OR_DEPARTMENT_OTHER): Payer: Self-pay

## 2023-06-05 MED ORDER — MOUNJARO 5 MG/0.5ML ~~LOC~~ SOAJ
5.0000 mg | SUBCUTANEOUS | 6 refills | Status: AC
  Filled 2023-06-05: qty 2, 28d supply, fill #0

## 2023-06-26 ENCOUNTER — Other Ambulatory Visit (HOSPITAL_BASED_OUTPATIENT_CLINIC_OR_DEPARTMENT_OTHER): Payer: Self-pay

## 2023-06-26 MED ORDER — MOUNJARO 7.5 MG/0.5ML ~~LOC~~ SOAJ
7.5000 mg | SUBCUTANEOUS | 1 refills | Status: AC
  Filled 2023-06-26: qty 2, 28d supply, fill #0
  Filled 2023-07-21: qty 2, 28d supply, fill #1
  Filled 2023-08-18: qty 2, 28d supply, fill #2

## 2023-07-17 ENCOUNTER — Ambulatory Visit
Admission: RE | Admit: 2023-07-17 | Discharge: 2023-07-17 | Disposition: A | Discharge status: Home / Self Care | Payer: 59 | Source: Ambulatory Visit | Attending: Family Medicine | Admitting: Family Medicine

## 2023-07-17 DIAGNOSIS — Z780 Asymptomatic menopausal state: Secondary | ICD-10-CM

## 2023-07-17 DIAGNOSIS — Z1231 Encounter for screening mammogram for malignant neoplasm of breast: Secondary | ICD-10-CM

## 2023-07-17 DIAGNOSIS — Z8262 Family history of osteoporosis: Secondary | ICD-10-CM

## 2023-07-20 ENCOUNTER — Other Ambulatory Visit (HOSPITAL_BASED_OUTPATIENT_CLINIC_OR_DEPARTMENT_OTHER): Payer: Self-pay

## 2023-07-20 MED ORDER — FREESTYLE LIBRE 3 PLUS SENSOR MISC
1.0000 | 0 refills | Status: DC
  Filled 2023-07-20: qty 6, 90d supply, fill #0

## 2023-07-21 ENCOUNTER — Other Ambulatory Visit (HOSPITAL_BASED_OUTPATIENT_CLINIC_OR_DEPARTMENT_OTHER): Payer: Self-pay

## 2023-07-23 ENCOUNTER — Other Ambulatory Visit (HOSPITAL_BASED_OUTPATIENT_CLINIC_OR_DEPARTMENT_OTHER): Payer: Self-pay

## 2023-07-27 ENCOUNTER — Other Ambulatory Visit (HOSPITAL_BASED_OUTPATIENT_CLINIC_OR_DEPARTMENT_OTHER): Payer: Self-pay

## 2023-08-18 ENCOUNTER — Other Ambulatory Visit (HOSPITAL_BASED_OUTPATIENT_CLINIC_OR_DEPARTMENT_OTHER): Payer: Self-pay

## 2023-08-20 ENCOUNTER — Other Ambulatory Visit (HOSPITAL_BASED_OUTPATIENT_CLINIC_OR_DEPARTMENT_OTHER): Payer: Self-pay

## 2023-08-20 MED ORDER — ROSUVASTATIN CALCIUM 20 MG PO TABS
20.0000 mg | ORAL_TABLET | Freq: Every day | ORAL | 0 refills | Status: DC
  Filled 2023-08-20: qty 90, 90d supply, fill #0

## 2023-08-23 ENCOUNTER — Other Ambulatory Visit (HOSPITAL_BASED_OUTPATIENT_CLINIC_OR_DEPARTMENT_OTHER): Payer: Self-pay

## 2023-08-23 MED ORDER — MOUNJARO 10 MG/0.5ML ~~LOC~~ SOAJ
10.0000 mg | SUBCUTANEOUS | 1 refills | Status: DC
  Filled 2023-08-23 – 2023-09-15 (×2): qty 2, 28d supply, fill #0
  Filled 2023-10-11: qty 2, 28d supply, fill #1

## 2023-09-17 ENCOUNTER — Other Ambulatory Visit (HOSPITAL_BASED_OUTPATIENT_CLINIC_OR_DEPARTMENT_OTHER): Payer: Self-pay

## 2023-10-11 ENCOUNTER — Other Ambulatory Visit (HOSPITAL_BASED_OUTPATIENT_CLINIC_OR_DEPARTMENT_OTHER): Payer: Self-pay

## 2023-10-11 ENCOUNTER — Other Ambulatory Visit: Payer: Self-pay

## 2023-10-11 MED ORDER — FREESTYLE LIBRE 3 PLUS SENSOR MISC
1 refills | Status: AC
  Filled 2023-10-11: qty 6, 90d supply, fill #0
  Filled 2024-01-09: qty 6, 90d supply, fill #1

## 2023-10-16 ENCOUNTER — Other Ambulatory Visit (HOSPITAL_BASED_OUTPATIENT_CLINIC_OR_DEPARTMENT_OTHER): Payer: Self-pay

## 2023-11-14 ENCOUNTER — Other Ambulatory Visit (HOSPITAL_BASED_OUTPATIENT_CLINIC_OR_DEPARTMENT_OTHER): Payer: Self-pay

## 2023-11-15 ENCOUNTER — Other Ambulatory Visit (HOSPITAL_BASED_OUTPATIENT_CLINIC_OR_DEPARTMENT_OTHER): Payer: Self-pay

## 2023-11-15 MED ORDER — ROSUVASTATIN CALCIUM 20 MG PO TABS
20.0000 mg | ORAL_TABLET | Freq: Every day | ORAL | 3 refills | Status: AC
  Filled 2023-11-15: qty 90, 90d supply, fill #0
  Filled 2024-02-19: qty 90, 90d supply, fill #1
  Filled 2024-05-15: qty 90, 90d supply, fill #2
  Filled 2024-08-13: qty 90, 90d supply, fill #3

## 2023-11-15 MED ORDER — MOUNJARO 10 MG/0.5ML ~~LOC~~ SOAJ
10.0000 mg | SUBCUTANEOUS | 1 refills | Status: DC
  Filled 2023-11-15: qty 2, 28d supply, fill #0
  Filled 2023-12-14: qty 2, 28d supply, fill #1

## 2023-11-15 MED ORDER — ROSUVASTATIN CALCIUM 20 MG PO TABS
20.0000 mg | ORAL_TABLET | Freq: Every day | ORAL | 3 refills | Status: AC
  Filled 2023-11-15: qty 90, 90d supply, fill #0

## 2023-12-14 ENCOUNTER — Other Ambulatory Visit: Payer: Self-pay

## 2023-12-14 ENCOUNTER — Other Ambulatory Visit (HOSPITAL_BASED_OUTPATIENT_CLINIC_OR_DEPARTMENT_OTHER): Payer: Self-pay

## 2023-12-14 MED ORDER — VENLAFAXINE HCL 37.5 MG PO TABS
37.5000 mg | ORAL_TABLET | Freq: Every day | ORAL | 1 refills | Status: DC
  Filled 2023-12-14: qty 90, 90d supply, fill #0
  Filled 2024-03-11: qty 90, 90d supply, fill #1

## 2024-01-09 ENCOUNTER — Other Ambulatory Visit: Payer: Self-pay

## 2024-01-09 ENCOUNTER — Other Ambulatory Visit (HOSPITAL_BASED_OUTPATIENT_CLINIC_OR_DEPARTMENT_OTHER): Payer: Self-pay

## 2024-01-09 MED ORDER — MOUNJARO 10 MG/0.5ML ~~LOC~~ SOAJ
10.0000 mg | SUBCUTANEOUS | 1 refills | Status: DC
  Filled 2024-01-09: qty 2, 28d supply, fill #0
  Filled 2024-02-06: qty 2, 28d supply, fill #1

## 2024-01-30 ENCOUNTER — Other Ambulatory Visit: Payer: Self-pay | Admitting: Family Medicine

## 2024-01-30 DIAGNOSIS — Z1231 Encounter for screening mammogram for malignant neoplasm of breast: Secondary | ICD-10-CM

## 2024-02-06 ENCOUNTER — Ambulatory Visit

## 2024-02-06 DIAGNOSIS — Z1231 Encounter for screening mammogram for malignant neoplasm of breast: Secondary | ICD-10-CM

## 2024-03-10 ENCOUNTER — Other Ambulatory Visit (HOSPITAL_BASED_OUTPATIENT_CLINIC_OR_DEPARTMENT_OTHER): Payer: Self-pay

## 2024-03-10 MED ORDER — MOUNJARO 10 MG/0.5ML ~~LOC~~ SOAJ
10.0000 mg | SUBCUTANEOUS | 1 refills | Status: AC
  Filled 2024-03-10: qty 2, 28d supply, fill #0

## 2024-04-07 ENCOUNTER — Other Ambulatory Visit (HOSPITAL_BASED_OUTPATIENT_CLINIC_OR_DEPARTMENT_OTHER): Payer: Self-pay

## 2024-04-07 MED ORDER — MOUNJARO 10 MG/0.5ML ~~LOC~~ SOAJ
10.0000 mg | SUBCUTANEOUS | 1 refills | Status: AC
  Filled 2024-04-07: qty 2, 28d supply, fill #0
  Filled 2024-05-01: qty 2, 28d supply, fill #1

## 2024-04-18 ENCOUNTER — Other Ambulatory Visit (HOSPITAL_BASED_OUTPATIENT_CLINIC_OR_DEPARTMENT_OTHER): Payer: Self-pay

## 2024-04-18 ENCOUNTER — Other Ambulatory Visit: Payer: Self-pay

## 2024-04-21 ENCOUNTER — Other Ambulatory Visit (HOSPITAL_BASED_OUTPATIENT_CLINIC_OR_DEPARTMENT_OTHER): Payer: Self-pay

## 2024-04-21 MED ORDER — FREESTYLE LIBRE 3 PLUS SENSOR MISC
1 refills | Status: AC
  Filled 2024-04-21: qty 6, 90d supply, fill #0
  Filled 2024-07-22: qty 6, 90d supply, fill #1

## 2024-04-28 ENCOUNTER — Other Ambulatory Visit (HOSPITAL_BASED_OUTPATIENT_CLINIC_OR_DEPARTMENT_OTHER): Payer: Self-pay

## 2024-06-03 ENCOUNTER — Other Ambulatory Visit: Payer: Self-pay

## 2024-06-03 ENCOUNTER — Other Ambulatory Visit (HOSPITAL_BASED_OUTPATIENT_CLINIC_OR_DEPARTMENT_OTHER): Payer: Self-pay

## 2024-06-03 MED ORDER — MOUNJARO 12.5 MG/0.5ML ~~LOC~~ SOAJ
12.5000 mg | SUBCUTANEOUS | 3 refills | Status: AC
  Filled 2024-06-03: qty 2, 28d supply, fill #0

## 2024-06-03 MED ORDER — ONDANSETRON 4 MG PO TBDP
ORAL_TABLET | Freq: Every day | ORAL | 0 refills | Status: AC
  Filled 2024-06-03: qty 30, 30d supply, fill #0

## 2024-06-09 ENCOUNTER — Other Ambulatory Visit: Payer: Self-pay

## 2024-06-09 ENCOUNTER — Other Ambulatory Visit (HOSPITAL_BASED_OUTPATIENT_CLINIC_OR_DEPARTMENT_OTHER): Payer: Self-pay

## 2024-06-09 MED ORDER — VENLAFAXINE HCL 37.5 MG PO TABS
37.5000 mg | ORAL_TABLET | Freq: Every day | ORAL | 0 refills | Status: AC
  Filled 2024-06-09: qty 90, 90d supply, fill #0

## 2024-06-10 ENCOUNTER — Other Ambulatory Visit (HOSPITAL_BASED_OUTPATIENT_CLINIC_OR_DEPARTMENT_OTHER): Payer: Self-pay

## 2024-07-01 ENCOUNTER — Other Ambulatory Visit (HOSPITAL_BASED_OUTPATIENT_CLINIC_OR_DEPARTMENT_OTHER): Payer: Self-pay

## 2024-07-01 MED ORDER — MOUNJARO 15 MG/0.5ML ~~LOC~~ SOAJ
15.0000 mg | SUBCUTANEOUS | 3 refills | Status: AC
  Filled 2024-07-01: qty 2, 28d supply, fill #0

## 2024-07-23 ENCOUNTER — Other Ambulatory Visit (HOSPITAL_BASED_OUTPATIENT_CLINIC_OR_DEPARTMENT_OTHER): Payer: Self-pay

## 2024-07-23 ENCOUNTER — Other Ambulatory Visit: Payer: Self-pay

## 2024-07-24 ENCOUNTER — Other Ambulatory Visit (HOSPITAL_BASED_OUTPATIENT_CLINIC_OR_DEPARTMENT_OTHER): Payer: Self-pay
